# Patient Record
Sex: Female | Born: 1995 | Race: Black or African American | Hispanic: No | Marital: Single | Smoking: Never smoker
Health system: Southern US, Community
[De-identification: ages and names within clinical notes are randomized; demographics above are authoritative.]

## PROBLEM LIST (undated history)

## (undated) HISTORY — PX: WISDOM TOOTH EXTRACTION: SHX21

---

## 2020-10-18 ENCOUNTER — Other Ambulatory Visit: Payer: Self-pay

## 2020-10-18 ENCOUNTER — Encounter (HOSPITAL_BASED_OUTPATIENT_CLINIC_OR_DEPARTMENT_OTHER): Payer: Self-pay | Admitting: Emergency Medicine

## 2020-10-18 ENCOUNTER — Emergency Department (HOSPITAL_BASED_OUTPATIENT_CLINIC_OR_DEPARTMENT_OTHER)
Admission: EM | Admit: 2020-10-18 | Discharge: 2020-10-18 | Disposition: A | Discharge status: Home / Self Care | Payer: Medicaid Other | Attending: Emergency Medicine | Admitting: Emergency Medicine

## 2020-10-18 ENCOUNTER — Emergency Department (HOSPITAL_BASED_OUTPATIENT_CLINIC_OR_DEPARTMENT_OTHER): Payer: Medicaid Other

## 2020-10-18 DIAGNOSIS — O4690 Antepartum hemorrhage, unspecified, unspecified trimester: Secondary | ICD-10-CM

## 2020-10-18 DIAGNOSIS — O418X10 Other specified disorders of amniotic fluid and membranes, first trimester, not applicable or unspecified: Secondary | ICD-10-CM

## 2020-10-18 DIAGNOSIS — R8271 Bacteriuria: Secondary | ICD-10-CM

## 2020-10-18 DIAGNOSIS — Z3A11 11 weeks gestation of pregnancy: Secondary | ICD-10-CM | POA: Insufficient documentation

## 2020-10-18 DIAGNOSIS — O2391 Unspecified genitourinary tract infection in pregnancy, first trimester: Secondary | ICD-10-CM | POA: Diagnosis not present

## 2020-10-18 DIAGNOSIS — N939 Abnormal uterine and vaginal bleeding, unspecified: Secondary | ICD-10-CM

## 2020-10-18 DIAGNOSIS — O209 Hemorrhage in early pregnancy, unspecified: Secondary | ICD-10-CM | POA: Diagnosis not present

## 2020-10-18 DIAGNOSIS — O468X1 Other antepartum hemorrhage, first trimester: Secondary | ICD-10-CM

## 2020-10-18 LAB — URINALYSIS, MICROSCOPIC (REFLEX)

## 2020-10-18 LAB — CBC WITH DIFFERENTIAL/PLATELET
Abs Immature Granulocytes: 0.01 10*3/uL (ref 0.00–0.07)
Basophils Absolute: 0 10*3/uL (ref 0.0–0.1)
Basophils Relative: 0 %
Eosinophils Absolute: 0.1 10*3/uL (ref 0.0–0.5)
Eosinophils Relative: 1 %
HCT: 30.5 % — ABNORMAL LOW (ref 36.0–46.0)
Hemoglobin: 11 g/dL — ABNORMAL LOW (ref 12.0–15.0)
Immature Granulocytes: 0 %
Lymphocytes Relative: 22 %
Lymphs Abs: 1.5 10*3/uL (ref 0.7–4.0)
MCH: 30.4 pg (ref 26.0–34.0)
MCHC: 36.1 g/dL — ABNORMAL HIGH (ref 30.0–36.0)
MCV: 84.3 fL (ref 80.0–100.0)
Monocytes Absolute: 0.5 10*3/uL (ref 0.1–1.0)
Monocytes Relative: 7 %
Neutro Abs: 4.9 10*3/uL (ref 1.7–7.7)
Neutrophils Relative %: 70 %
Platelets: 219 10*3/uL (ref 150–400)
RBC: 3.62 MIL/uL — ABNORMAL LOW (ref 3.87–5.11)
RDW: 12.5 % (ref 11.5–15.5)
WBC: 7 10*3/uL (ref 4.0–10.5)
nRBC: 0 % (ref 0.0–0.2)

## 2020-10-18 LAB — URINALYSIS, ROUTINE W REFLEX MICROSCOPIC
Bilirubin Urine: NEGATIVE
Glucose, UA: NEGATIVE mg/dL
Ketones, ur: NEGATIVE mg/dL
Leukocytes,Ua: NEGATIVE
Nitrite: NEGATIVE
Protein, ur: NEGATIVE mg/dL
Specific Gravity, Urine: 1.015 (ref 1.005–1.030)
pH: 7.5 (ref 5.0–8.0)

## 2020-10-18 LAB — WET PREP, GENITAL
Clue Cells Wet Prep HPF POC: NONE SEEN
Sperm: NONE SEEN
Trich, Wet Prep: NONE SEEN
Yeast Wet Prep HPF POC: NONE SEEN

## 2020-10-18 MED ORDER — CEPHALEXIN 500 MG PO CAPS: 500.0000 mg | ORAL_CAPSULE | Freq: Two times a day (BID) | ORAL | 0 refills | Status: AC

## 2020-10-18 NOTE — ED Notes (Signed)
Pt discharged to home. Discharge instructions have been discussed with patient and/or family members. Pt verbally acknowledges understanding d/c instructions, and endorses comprehension to checkout at registration before leaving.  °

## 2020-10-18 NOTE — ED Notes (Signed)
Chaperone for EDP for vaginal exam. Pt tolerated well. 

## 2020-10-18 NOTE — Discharge Instructions (Addendum)
You were seen in the emergency department today with vaginal bleeding.  You continue to have an area of bleeding surrounding your uterus but the baby appears to be doing well.  Please keep your OB appointment on the 22nd. Return to the ED with any new or worsening symptoms such as severe pain or if you begin soaking though more than 1 pad per hour.   You did have a small amount of bacteria in your urine and I am treating you with 1 week of antibiotics.

## 2020-10-18 NOTE — ED Notes (Signed)
Pt returned from US

## 2020-10-18 NOTE — ED Notes (Signed)
Patient transported to Ultrasound 

## 2020-10-18 NOTE — ED Notes (Signed)
ED Provider at bedside. 

## 2020-10-18 NOTE — ED Provider Notes (Signed)
Emergency Department Provider Note   I have reviewed the triage vital signs and the nursing notes.   HISTORY  Chief Complaint Vaginal Bleeding   HPI Sheila Oneal is a 25 y.o. female G2, P1 at [redacted]w[redacted]d by dates with confirmed IUP followed at [facility] presents to the emergency department with return of vaginal bleeding. Patient was seen by her OB/GYN 10 days ago with vaginal bleeding and at that time had an ultrasound showing subchorionic hemorrhage but otherwise normal-appearing intrauterine pregnancy. She is blood type AB+ by those notes. She was discharged home with plan for pelvic rest and continued outpatient follow-up but this morning developed vaginal bleeding. She states that at first it seemed dark, old appearing blood but throughout the morning became more red in appearance. She denies any lightheadedness or generalized weakness. She is having minimal to no abdominal cramping. No dysuria, hesitancy, urgency. No fevers or chills. No radiation of symptoms or modifying factors.    History reviewed. No pertinent past medical history.  There are no problems to display for this patient.  Allergies Patient has no allergy information on record.  History reviewed. No pertinent family history.  Social History Social History   Substance Use Topics  . Alcohol use: Not Currently  . Drug use: Never    Review of Systems  Constitutional: No fever/chills Eyes: No visual changes. ENT: No sore throat. Cardiovascular: Denies chest pain. Respiratory: Denies shortness of breath. Gastrointestinal: No abdominal pain.  No nausea, no vomiting.  No diarrhea.  No constipation. Genitourinary: Negative for dysuria. Positive vaginal bleeding.  Musculoskeletal: Negative for back pain. Skin: Negative for rash. Neurological: Negative for headaches, focal weakness or numbness.  10-point ROS otherwise negative.  ____________________________________________   PHYSICAL  EXAM:  VITAL SIGNS: Vitals:   10/18/20 0755 10/18/20 0940  BP: 97/65 94/62  Pulse: 94 80  Resp: 16 16  Temp:    SpO2: 100% 99%    Constitutional: Alert and oriented. Well appearing and in no acute distress. Eyes: Conjunctivae are normal.  Head: Atraumatic. Nose: No congestion/rhinnorhea. Mouth/Throat: Mucous membranes are moist.   Neck: No stridor.   Cardiovascular: Normal rate, regular rhythm. Good peripheral circulation. Grossly normal heart sounds.   Respiratory: Normal respiratory effort.  No retractions. Lungs CTAB. Gastrointestinal: Soft and nontender. No distention.  Genitourinary: Exam performed with patient's verbal consent and nurse chaperone. External genitalia is normal. On speculum exam the patient has a visually closed cervix with mild dark red blood, some coming from the cervical os. No pooling fluid.  Musculoskeletal: No gross deformities of extremities. Neurologic:  Normal speech and language. Skin:  Skin is warm, dry and intact. No rash noted.  ____________________________________________   LABS (all labs ordered are listed, but only abnormal results are displayed)  Labs Reviewed  WET PREP, GENITAL - Abnormal; Notable for the following components:      Result Value   WBC, Wet Prep HPF POC MANY (*)    All other components within normal limits  CBC WITH DIFFERENTIAL/PLATELET - Abnormal; Notable for the following components:   RBC 3.62 (*)    Hemoglobin 11.0 (*)    HCT 30.5 (*)    MCHC 36.1 (*)    All other components within normal limits  URINALYSIS, ROUTINE W REFLEX MICROSCOPIC - Abnormal; Notable for the following components:   Hgb urine dipstick LARGE (*)    All other components within normal limits  URINALYSIS, MICROSCOPIC (REFLEX) - Abnormal; Notable for the following components:  Bacteria, UA FEW (*)    All other components within normal limits  URINE CULTURE  GC/CHLAMYDIA PROBE AMP ([place]) NOT AT [facility]    ____________________________________________  RADIOLOGY  US OB LESS THAN 14 WEEKS WITH OB TRANSVAGINAL  Result Date: 10/18/2020 CLINICAL DATA:  Vaginal bleeding in early pregnancy EXAM: OBSTETRIC <14 WK [place] AND TRANSVAGINAL OB US TECHNIQUE: Both transabdominal and transvaginal ultrasound examinations were performed for complete evaluation of the gestation as well as the maternal uterus, adnexal regions, and pelvic cul-de-sac. Transvaginal technique was performed to assess early pregnancy. COMPARISON:  None. FINDINGS: Intrauterine gestational sac: Single Yolk sac:  Not Visualized. Embryo:  Visualized. Cardiac Activity: Visualized. Heart Rate: 162 bpm CRL:  56.4 mm   12 w   2 d                  [place] EDC: 04/30/2021 Maternal uterus/adnexae: Subchorionic hemorrhage: Large subchorionic hematoma is identified measuring 7.6 x 0.9 x 4.8 cm. (Volume = 20 cm^3) Right ovary: Mildly complex cyst measures 2 x 1.7 x 1.9 cm. This is most likely benign containing what appears to be either resolving corpus luteum or retracting clot associated with hemorrhagic cyst Left ovary: Normal Other :Fluid noted within the cervical canal. Free fluid:  Trace free fluid within the pelvis. IMPRESSION: 1. Single living intrauterine gestation with an estimated gestational age of [redacted] weeks and 2 days 2. Moderate to large subchorionic hemorrhage with a volume of approximately 20 cc. Electronically Signed   By: Signa Kell M.D.   On: 10/18/2020 09:08    ____________________________________________   PROCEDURES  Procedure(s) performed:   Procedures  None  ____________________________________________   INITIAL IMPRESSION / ASSESSMENT AND PLAN / ED COURSE  Pertinent labs & imaging results that were available during my care of the patient were reviewed by me and considered in my medical decision making (see chart for details).   Patient presents to the emergency department with return of vaginal bleeding in the setting of her  second pregnancy with known subchorionic hemorrhage. She is AB+ by review of her prior OB notes and so RhoGam is not indicated. I performed a bedside ultrasound to evaluate her fetus. The fetus is exhibiting spontaneous movement with positive cardiac activity. Formal US not available at this site. Review of records from [facility] show formal OB ultrasound performed on 2/9 confirming intrauterine pregnancy. No further w/u required in terms of r/o ectopic pregnancy. Sent wet pre, STD testing, and UA. Will perform screening CBC.    Ultrasound shows a moderate to large volume subchorionic hemorrhage.  UA with few bacteria but no other signs of urine infection.  Plan to treat asymptomatic bacteriuria in pregnancy with Keflex.  Discussed the case with the patient's OB/GYN provider on call who advises no additional intervention today.  Cautioned that patient should return to the emergency department with increased bleeding where she would be soaking through 1 pad per hour.  Advised patient that if possible she should present to the hospital where her OB/GYN team is located here in [facility] and/or call 911 if symptoms are severe.  Tachycardia has improved without intervention here.  CBC reviewed. Patient stable and ready for d/c.  ____________________________________________  FINAL CLINICAL IMPRESSION(S) / ED DIAGNOSES  Final diagnoses:  Vaginal bleeding in pregnancy  Subchorionic hemorrhage of placenta in first trimester, single or unspecified fetus  Asymptomatic bacteriuria during pregnancy    NEW OUTPATIENT MEDICATIONS STARTED DURING THIS VISIT:  New Prescriptions   CEPHALEXIN (KEFLEX) 500  MG CAPSULE    Take 1 capsule (500 mg total) by mouth 2 (two) times daily for 7 days.    Note:  This document was prepared using Dragon voice recognition software and may include unintentional dictation errors.  Alona Bene, MD, [facility] Emergency Medicine    Annistyn Depass, Arlyss Repress, MD 10/18/20 1028

## 2020-10-18 NOTE — ED Triage Notes (Signed)
Pt arrives with driver, c/o vaginal bleeding that started last night. Pt endorses [redacted] weeks pregnant

## 2020-10-19 LAB — URINE CULTURE

## 2020-10-20 LAB — GC/CHLAMYDIA PROBE AMP (~~LOC~~) NOT AT ARMC
Chlamydia: NEGATIVE
Comment: NEGATIVE
Comment: NORMAL
Neisseria Gonorrhea: NEGATIVE

## 2022-02-07 IMAGING — US US OB < 14 WEEKS - US OB TV
1 series · 13 of 28 positions shown · non-contrast
Comparison: None.

CLINICAL DATA: Vaginal bleeding in early pregnancy

EXAM:
OBSTETRIC <14 WK US AND TRANSVAGINAL OB US
TECHNIQUE: Both transabdominal and transvaginal ultrasound examinations were
performed for complete evaluation of the gestation as well as the
maternal uterus, adnexal regions, and pelvic cul-de-sac.
Transvaginal technique was performed to assess early pregnancy.

[Series 1: us ob < 14 weeks - us ob tv · 13 of 130 slices shown]
[im 5/130]
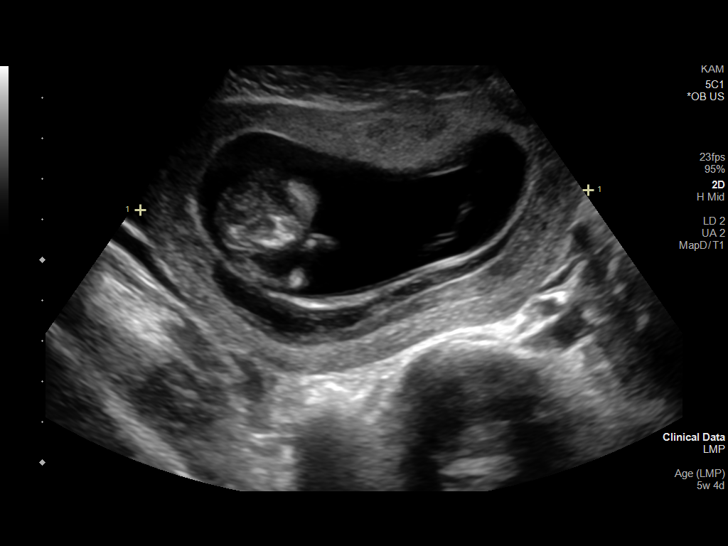
[im 15/130]
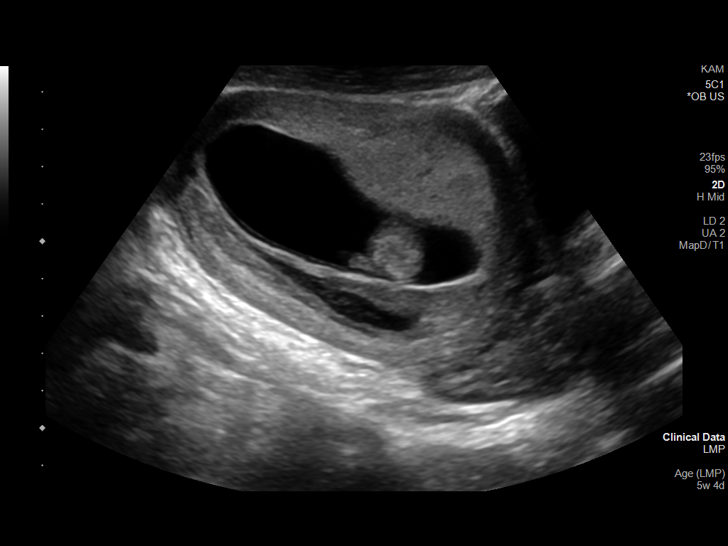
[im 24/130]
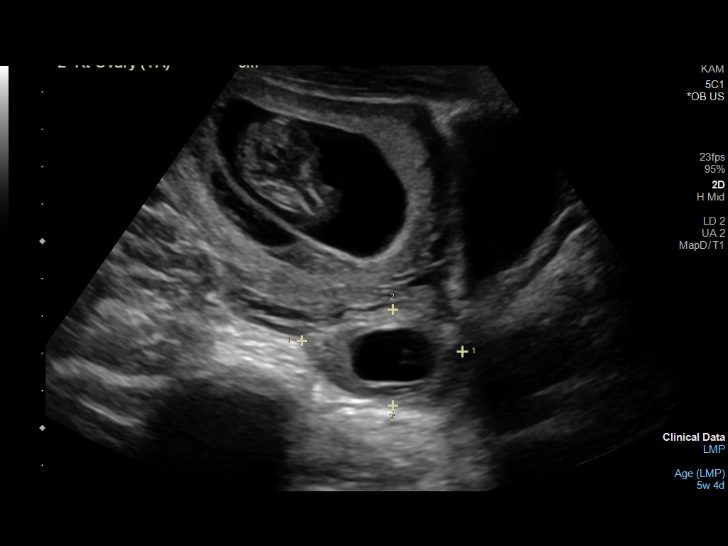
[im 34/130]
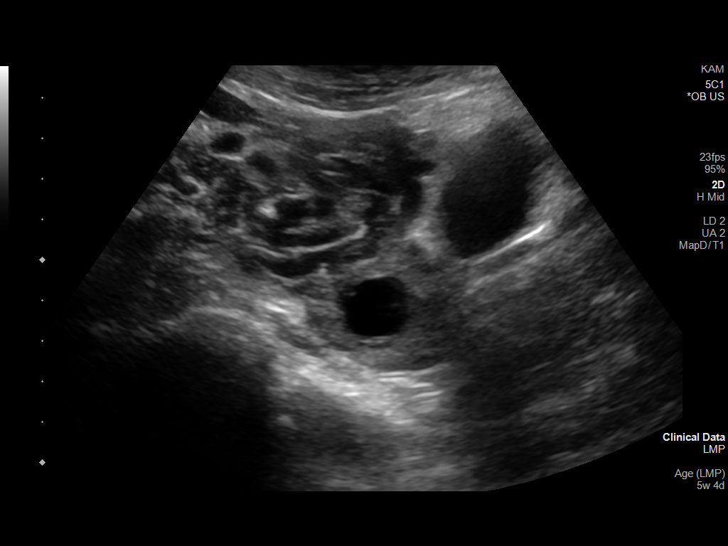
[im 44/130]
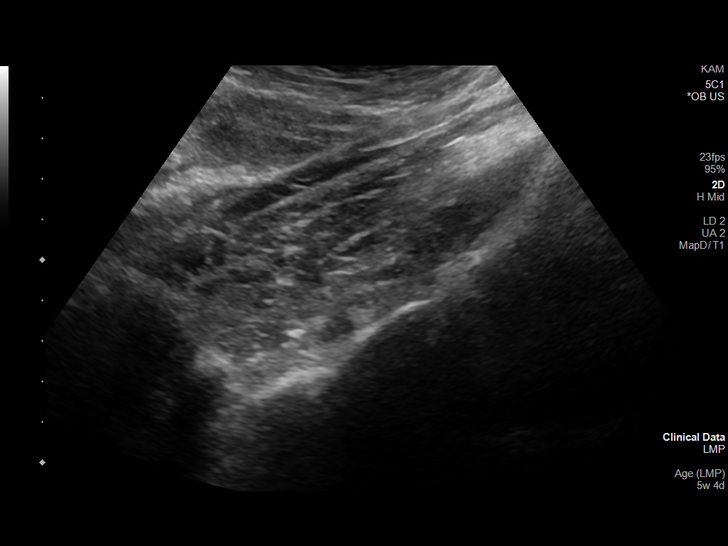
[im 53/130]
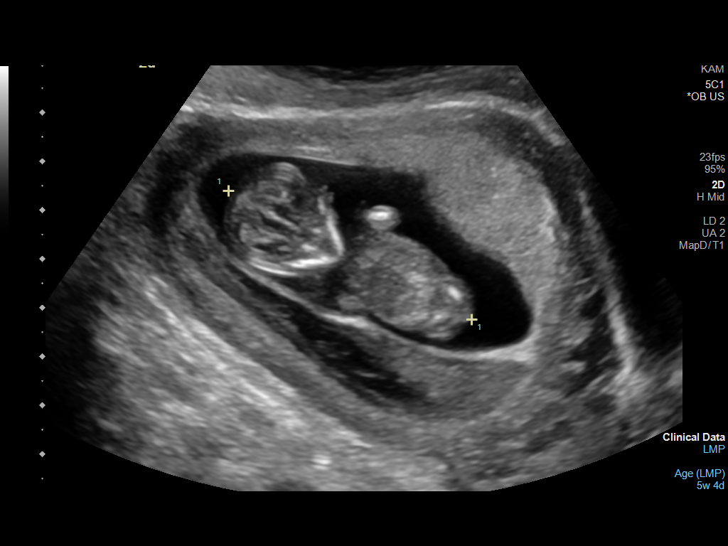
[im 67/130]
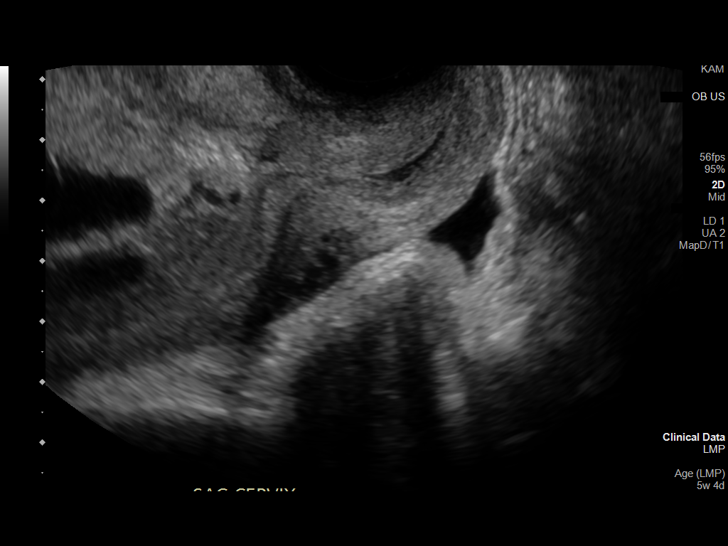
[im 77/130]
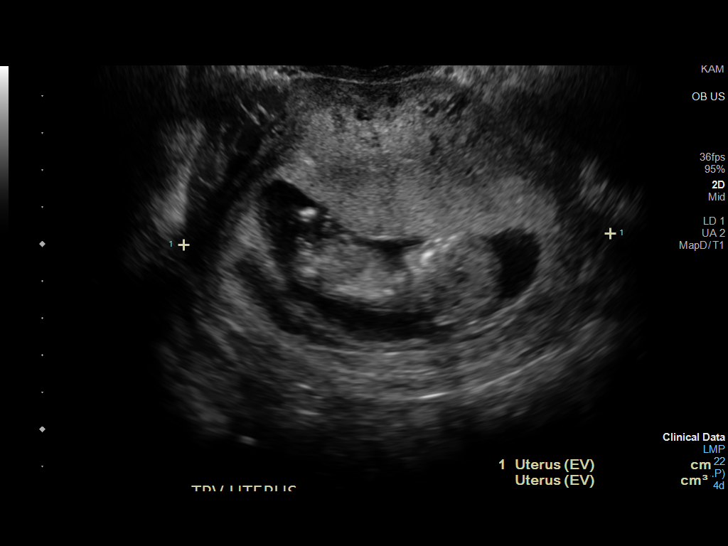
[im 87/130]
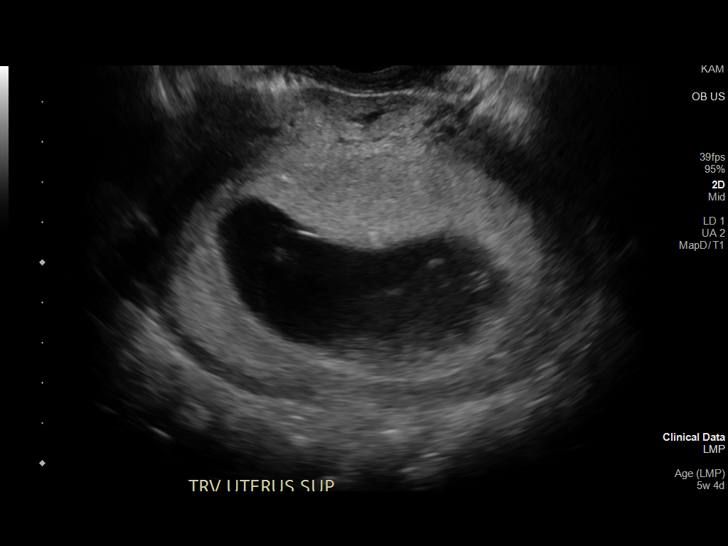
[im 96/130]
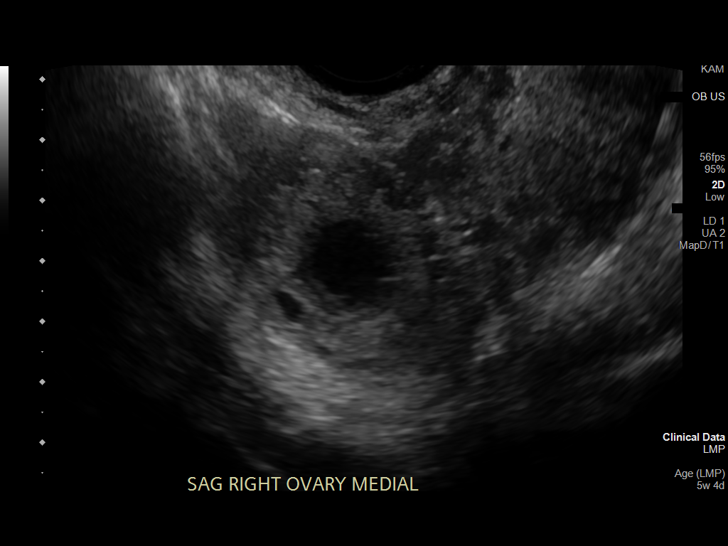
[im 106/130]
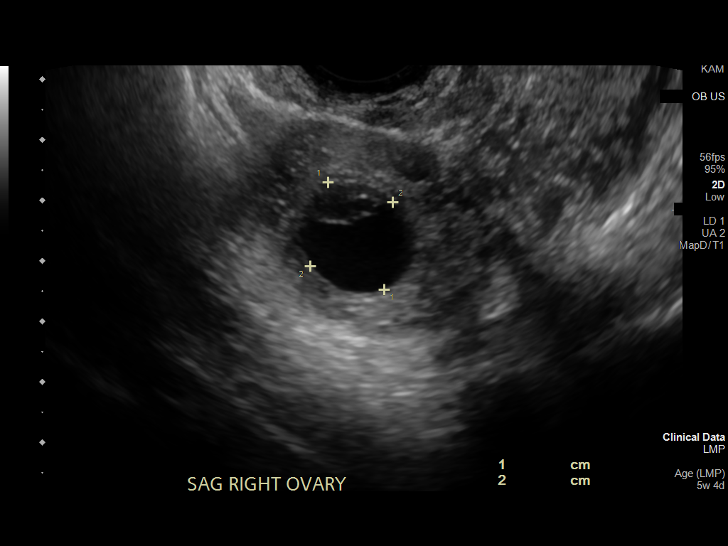
[im 115/130]
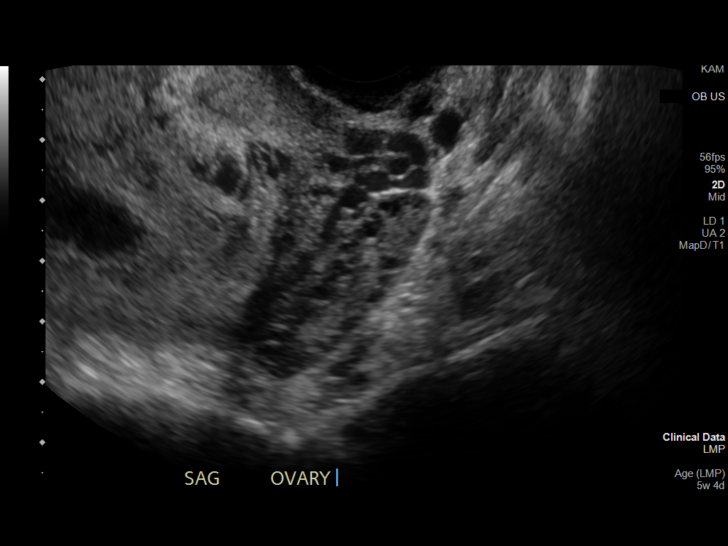
[im 125/130]
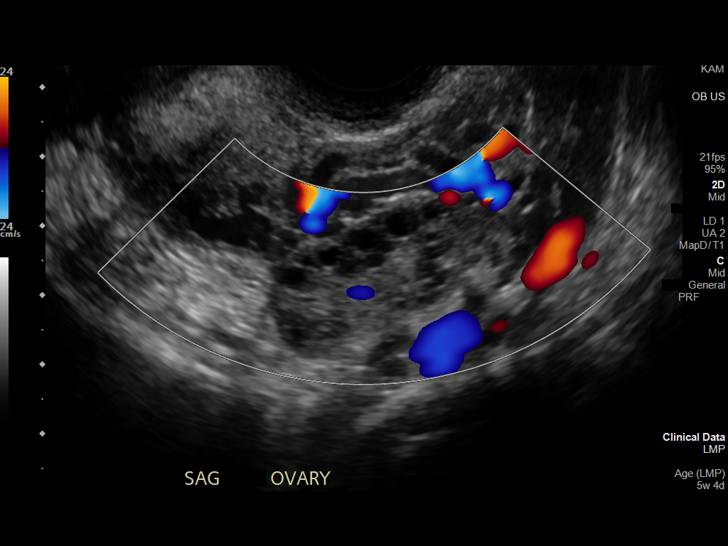

[13 of 28 positions shown; findings below may reference images not displayed]

FINDINGS: Intrauterine gestational sac: Single

Yolk sac:  Not Visualized.

Embryo:  Visualized.

Cardiac Activity: Visualized.

Heart Rate: 162 bpm

CRL:  56.4 mm   12 w   2 d                  US EDC: 04/30/2021

Maternal uterus/adnexae:

Subchorionic hemorrhage: Large subchorionic hematoma is identified
measuring 7.6 x 0.9 x 4.8 cm. (Volume = 20 cm^3)

Right ovary: Mildly complex cyst measures 2 x 1.7 x 1.9 cm. This is
most likely benign containing what appears to be either resolving
corpus luteum or retracting clot associated with hemorrhagic cyst

Left ovary: Normal

Other :Fluid noted within the cervical canal.

Free fluid:  Trace free fluid within the pelvis.
IMPRESSION: 1. Single living intrauterine gestation with an estimated
gestational age of 12 weeks and 2 days
2. Moderate to large subchorionic hemorrhage with a volume of
approximately 20 cc.

## 2022-08-21 ENCOUNTER — Other Ambulatory Visit: Payer: Self-pay

## 2022-08-21 ENCOUNTER — Encounter (HOSPITAL_BASED_OUTPATIENT_CLINIC_OR_DEPARTMENT_OTHER): Payer: Self-pay | Admitting: Emergency Medicine

## 2022-08-21 ENCOUNTER — Emergency Department (HOSPITAL_BASED_OUTPATIENT_CLINIC_OR_DEPARTMENT_OTHER)
Admission: EM | Admit: 2022-08-21 | Discharge: 2022-08-21 | Discharge status: Against Medical Advice | Payer: Medicaid Other | Attending: Emergency Medicine | Admitting: Emergency Medicine

## 2022-08-21 DIAGNOSIS — M7918 Myalgia, other site: Secondary | ICD-10-CM | POA: Insufficient documentation

## 2022-08-21 DIAGNOSIS — R519 Headache, unspecified: Secondary | ICD-10-CM | POA: Diagnosis not present

## 2022-08-21 DIAGNOSIS — R110 Nausea: Secondary | ICD-10-CM | POA: Insufficient documentation

## 2022-08-21 DIAGNOSIS — J029 Acute pharyngitis, unspecified: Secondary | ICD-10-CM | POA: Insufficient documentation

## 2022-08-21 DIAGNOSIS — Z20822 Contact with and (suspected) exposure to covid-19: Secondary | ICD-10-CM | POA: Diagnosis not present

## 2022-08-21 DIAGNOSIS — Z5321 Procedure and treatment not carried out due to patient leaving prior to being seen by health care provider: Secondary | ICD-10-CM | POA: Diagnosis not present

## 2022-08-21 LAB — RESP PANEL BY RT-PCR (RSV, FLU A&B, COVID)  RVPGX2
Influenza A by PCR: NEGATIVE
Influenza B by PCR: NEGATIVE
Resp Syncytial Virus by PCR: NEGATIVE
SARS Coronavirus 2 by RT PCR: NEGATIVE

## 2022-08-21 LAB — GROUP A STREP BY PCR: Group A Strep by PCR: NOT DETECTED

## 2022-08-21 NOTE — ED Triage Notes (Signed)
Patient c/o chills, body ache, nausea, headache, and sore throat x 3 days.

## 2024-04-09 NOTE — Telephone Encounter (Signed)
 Call returned, name and dob verified. Sheila Oneal is currently [redacted]w[redacted]d pregnant and reports that she has been experiencing intermittently consistent throbbing headaches since last Monday. She reports that she has previously had migraines a long time ago prior to wearing her glasses. However she has not had them recently. She is regularly drinking 2-3 bottles of water daily. Her urine is yellow/clear. She was informed that sometimes headaches can worsen/reappear in pregnancy due to hormonal changes.   Pt was advised to make sure that she is staying adequately hydrated and informed that she can add one electrolyte drink daily. She was also advised to refrain from taking Advil/nsaids in pregnancy and informed that she can take tylenol, safe dosing discussed. She was advised to take 400 mg of magnesium at bedtime to see if that helps with her headaches. If no improvement she is to notify or go to the ED for worsening severe neurological changes. Pt has verbalized understanding.

## 2024-04-11 ENCOUNTER — Emergency Department (HOSPITAL_BASED_OUTPATIENT_CLINIC_OR_DEPARTMENT_OTHER): Admission: EM | Admit: 2024-04-11 | Discharge: 2024-04-11 | Discharge status: Against Medical Advice

## 2024-04-11 ENCOUNTER — Other Ambulatory Visit: Payer: Self-pay

## 2024-04-11 ENCOUNTER — Encounter (HOSPITAL_BASED_OUTPATIENT_CLINIC_OR_DEPARTMENT_OTHER): Payer: Self-pay | Admitting: Emergency Medicine

## 2024-04-11 DIAGNOSIS — O26891 Other specified pregnancy related conditions, first trimester: Secondary | ICD-10-CM | POA: Diagnosis present

## 2024-04-11 DIAGNOSIS — Z3A13 13 weeks gestation of pregnancy: Secondary | ICD-10-CM | POA: Diagnosis not present

## 2024-04-11 DIAGNOSIS — Z5321 Procedure and treatment not carried out due to patient leaving prior to being seen by health care provider: Secondary | ICD-10-CM | POA: Insufficient documentation

## 2024-04-11 DIAGNOSIS — R519 Headache, unspecified: Secondary | ICD-10-CM | POA: Diagnosis not present

## 2024-04-11 LAB — CBC WITH DIFFERENTIAL/PLATELET
Abs Immature Granulocytes: 0.03 K/uL (ref 0.00–0.07)
Basophils Absolute: 0 K/uL (ref 0.0–0.1)
Basophils Relative: 0 %
Eosinophils Absolute: 0.1 K/uL (ref 0.0–0.5)
Eosinophils Relative: 1 %
HCT: 34.3 % — ABNORMAL LOW (ref 36.0–46.0)
Hemoglobin: 12.3 g/dL (ref 12.0–15.0)
Immature Granulocytes: 0 %
Lymphocytes Relative: 22 %
Lymphs Abs: 1.9 K/uL (ref 0.7–4.0)
MCH: 30.1 pg (ref 26.0–34.0)
MCHC: 35.9 g/dL (ref 30.0–36.0)
MCV: 84.1 fL (ref 80.0–100.0)
Monocytes Absolute: 0.5 K/uL (ref 0.1–1.0)
Monocytes Relative: 6 %
Neutro Abs: 6 K/uL (ref 1.7–7.7)
Neutrophils Relative %: 71 %
Platelets: 249 K/uL (ref 150–400)
RBC: 4.08 MIL/uL (ref 3.87–5.11)
RDW: 12.4 % (ref 11.5–15.5)
WBC: 8.5 K/uL (ref 4.0–10.5)
nRBC: 0 % (ref 0.0–0.2)

## 2024-04-11 LAB — COMPREHENSIVE METABOLIC PANEL WITH GFR
ALT: 15 U/L (ref 0–44)
AST: 23 U/L (ref 15–41)
Albumin: 4.2 g/dL (ref 3.5–5.0)
Alkaline Phosphatase: 54 U/L (ref 38–126)
Anion gap: 11 (ref 5–15)
BUN: 5 mg/dL — ABNORMAL LOW (ref 6–20)
CO2: 21 mmol/L — ABNORMAL LOW (ref 22–32)
Calcium: 8.9 mg/dL (ref 8.9–10.3)
Chloride: 104 mmol/L (ref 98–111)
Creatinine, Ser: 0.63 mg/dL (ref 0.44–1.00)
GFR, Estimated: >60 mL/min (ref >60)
Glucose, Bld: 93 mg/dL (ref 70–99)
Potassium: 3.6 mmol/L (ref 3.5–5.1)
Sodium: 135 mmol/L (ref 135–145)
Total Bilirubin: 0.4 mg/dL (ref 0.0–1.2)
Total Protein: 6.9 g/dL (ref 6.5–8.1)

## 2024-04-11 NOTE — ED Triage Notes (Signed)
 Pt c/o headache x 2 weeks, light sensitivity at night. Denies n/v.  Reports good po intake.   G3P2, EDD 10/16/2024, denies complications with this pregnancy.
